# Patient Record
Sex: Female | Born: 1945 | Race: White | Hispanic: No | Marital: Married
Health system: Southern US, Community
[De-identification: ages and names within clinical notes are randomized; demographics above are authoritative.]

## PROBLEM LIST (undated history)

## (undated) DIAGNOSIS — F32A Depression, unspecified: Secondary | ICD-10-CM

## (undated) DIAGNOSIS — E079 Disorder of thyroid, unspecified: Secondary | ICD-10-CM

## (undated) DIAGNOSIS — E119 Type 2 diabetes mellitus without complications: Secondary | ICD-10-CM

---

## 2020-08-12 ENCOUNTER — Emergency Department (HOSPITAL_COMMUNITY)
Admission: EM | Admit: 2020-08-12 | Discharge: 2020-08-12 | Disposition: A | Discharge status: Home / Self Care | Payer: Medicare Other | Attending: Emergency Medicine | Admitting: Emergency Medicine

## 2020-08-12 ENCOUNTER — Emergency Department (HOSPITAL_COMMUNITY): Payer: Medicare Other

## 2020-08-12 ENCOUNTER — Encounter (HOSPITAL_COMMUNITY): Payer: Self-pay | Admitting: Emergency Medicine

## 2020-08-12 DIAGNOSIS — S4992XA Unspecified injury of left shoulder and upper arm, initial encounter: Secondary | ICD-10-CM | POA: Diagnosis present

## 2020-08-12 DIAGNOSIS — E119 Type 2 diabetes mellitus without complications: Secondary | ICD-10-CM | POA: Diagnosis not present

## 2020-08-12 DIAGNOSIS — W010XXA Fall on same level from slipping, tripping and stumbling without subsequent striking against object, initial encounter: Secondary | ICD-10-CM | POA: Insufficient documentation

## 2020-08-12 DIAGNOSIS — Y92009 Unspecified place in unspecified non-institutional (private) residence as the place of occurrence of the external cause: Secondary | ICD-10-CM | POA: Diagnosis not present

## 2020-08-12 DIAGNOSIS — S42202A Unspecified fracture of upper end of left humerus, initial encounter for closed fracture: Secondary | ICD-10-CM | POA: Insufficient documentation

## 2020-08-12 HISTORY — DX: Disorder of thyroid, unspecified: E07.9

## 2020-08-12 HISTORY — DX: Depression, unspecified: F32.A

## 2020-08-12 HISTORY — DX: Type 2 diabetes mellitus without complications: E11.9

## 2020-08-12 MED ORDER — HYDROCODONE-ACETAMINOPHEN 5-325 MG PO TABS
1.0000 | ORAL_TABLET | Freq: Once | ORAL | Status: AC
  Administered 2020-08-12: 1 via ORAL
  Filled 2020-08-12: qty 1

## 2020-08-12 NOTE — Progress Notes (Signed)
Orthopedic Tech Progress Note Patient Details:  Dominique Lynch [birth year] [id number]  Ortho Devices Ortho Device/Splint Location: applied arm sling to LUE Ortho Device/Splint Interventions: Ordered, Application   Post Interventions Patient Tolerated: Well Instructions Provided: Care of device   Jennye Moccasin 08/12/2020, 7:13 PM

## 2020-08-12 NOTE — ED Triage Notes (Signed)
Per EMS, patient from home, trip and fall landing on left shoulder. Pain to left shoulder and arm. Denies head injury, dizziness, and LOC.

## 2020-08-13 NOTE — ED Provider Notes (Signed)
[facility]-EMERGENCY DEPT Provider Note   CSN: [id number] Arrival date & time: 08/12/20  1400     History Chief Complaint  Patient presents with  . Fall    Dominique Lynch is a 74 y.o. female.  HPI      74yo female with history of DM, depression, thyroid disease presents with concern for fall and left shoulder pain.  She was helping her granddaughter move when she tripped over a box falling directly onto her left shoulder. No head trauma, no LOC, no neck pain or back pain. No numbness/weakness/n/v. No other areas of pain with exception of some elbow pain.  Has otherwise been in normal state of health.   Past Medical History:  Diagnosis Date  . Depression   . Diabetes mellitus without complication (HCC)   . Thyroid disease     There are no problems to display for this patient.     OB History   No obstetric history on file.     No family history on file.  Social History   Tobacco Use  . Smoking status: Not on file  Substance Use Topics  . Alcohol use: Not on file  . Drug use: Not on file    Home Medications Prior to Admission medications   Not on File    Allergies    Ace inhibitors  Review of Systems   Review of Systems  Constitutional: Negative for fever.  Eyes: Negative for visual disturbance.  Respiratory: Negative for cough and shortness of breath.   Cardiovascular: Negative for chest pain.  Gastrointestinal: Negative for abdominal pain, nausea and vomiting.  Genitourinary: Negative for difficulty urinating.  Musculoskeletal: Positive for arthralgias. Negative for back pain and neck pain.  Skin: Negative for rash.  Neurological: Negative for syncope, weakness, numbness and headaches.    Physical Exam Updated Vital Signs BP 129/73   Pulse 74   Temp 98.2 F (36.8 C)   Resp 16   Ht 5\' 1"  (1.549 m)   Wt 99.8 kg   SpO2 99%   BMI 41.57 kg/m   Physical Exam Vitals and nursing note reviewed.  Constitutional:       General: She is not in acute distress.    Appearance: She is well-developed. She is not diaphoretic.  HENT:     Head: Normocephalic and atraumatic.  Eyes:     Conjunctiva/sclera: Conjunctivae normal.  Cardiovascular:     Rate and Rhythm: Normal rate and regular rhythm.     Heart sounds: No gallop.   Pulmonary:     Effort: Pulmonary effort is normal. No respiratory distress.  Chest:     Chest wall: No tenderness.  Abdominal:     General: There is no distension.     Palpations: Abdomen is soft.     Tenderness: There is no abdominal tenderness. There is no guarding.  Musculoskeletal:        General: No tenderness.     Cervical back: Normal range of motion.     Comments: No tenderness of c/t/l spine (reports chronic l spine pain)   Normal strength/sensation distal UE/finger abduction/opponens/wrist abduction  Elbow diffuse tenderness, overlying abrasion  Tenderness and pain with minimal movement left upper arm  Skin:    General: Skin is warm and dry.     Findings: No erythema or rash.  Neurological:     Mental Status: She is alert and oriented to person, place, and time.     ED Results / Procedures / Treatments  Labs (all labs ordered are listed, but only abnormal results are displayed) Labs Reviewed - No data to display  EKG None  Radiology DG Shoulder Left  Result Date: 08/12/2020 CLINICAL DATA:  Fall with left shoulder pain EXAM: LEFT SHOULDER - 2+ VIEW COMPARISON:  None. FINDINGS: AC joint is intact. There is an acute mildly imp comminuted and impacted fracture involving the left humeral neck. Humeral head appears to project over the glenoid fossa. IMPRESSION: Acute mildly comminuted and impacted fracture involving the left humeral neck. Electronically Signed   By: Jasmine Pang M.D.   On: 08/12/2020 15:09   DG Humerus Left  Result Date: 08/12/2020 CLINICAL DATA:  Fall proximal humerus pain EXAM: LEFT HUMERUS - 2+ VIEW COMPARISON:  None. FINDINGS: Acute mildly  impacted fracture involving the left humeral neck. Mid to distal humerus is intact. IMPRESSION: Acute mildly impacted fracture involving the left humeral neck. Electronically Signed   By: Jasmine Pang M.D.   On: 08/12/2020 15:08    Procedures Procedures (including critical care time)  Medications Ordered in ED Medications  HYDROcodone-acetaminophen (NORCO/VICODIN) 5-325 MG per tablet 1 tablet (1 tablet Oral Given 08/12/20 1857)    ED Course  I have reviewed the triage vital signs and the nursing notes.  Pertinent labs & imaging results that were available during my care of the patient were reviewed by me and considered in my medical decision making (see chart for details).    MDM Rules/Calculators/A&P                          Very pleasant 74yo female with history of DM, depression, thyroid disease presents with concern for fall and left shoulder pain.  Mechanical fall with no head injury, headache, not on anticoagulation with exception of aspirin, low suspicion for ICH or spinal injury by history and exam.  Low suspicion for other acute medical concerns.  Has abrasion over left elbow. Discussed getting specific XR of left elbow given diffuse tenderness in this location however she prefers follow up with Orthopedics.  Humerus XR is limited for elbow evaluation but does not show any significant abnormalities and she declines furhter imaging at this time.  XR left shoulder and humerus show fracture left humeral neck fracture.  She is NV intact, closed fracture.  Placed in sling.  Has tramadol rx at home and declines other pain control. Sees [facility] for her back and would like to follow up with this group. Patient discharged in stable condition with understanding of reasons to return.    Final Clinical Impression(s) / ED Diagnoses Final diagnoses:  Closed fracture of proximal end of left humerus, unspecified fracture morphology, initial encounter    Rx / DC Orders ED Discharge  Orders    None       Alvira Monday, MD 08/13/20 1200

## 2021-04-08 IMAGING — CR DG HUMERUS 2V *L*
2 series · 2 of 2 positions shown · non-contrast
Comparison: None.

CLINICAL DATA: Fall proximal humerus pain

EXAM:
LEFT HUMERUS - 2+ VIEW

[w humerus ap left]
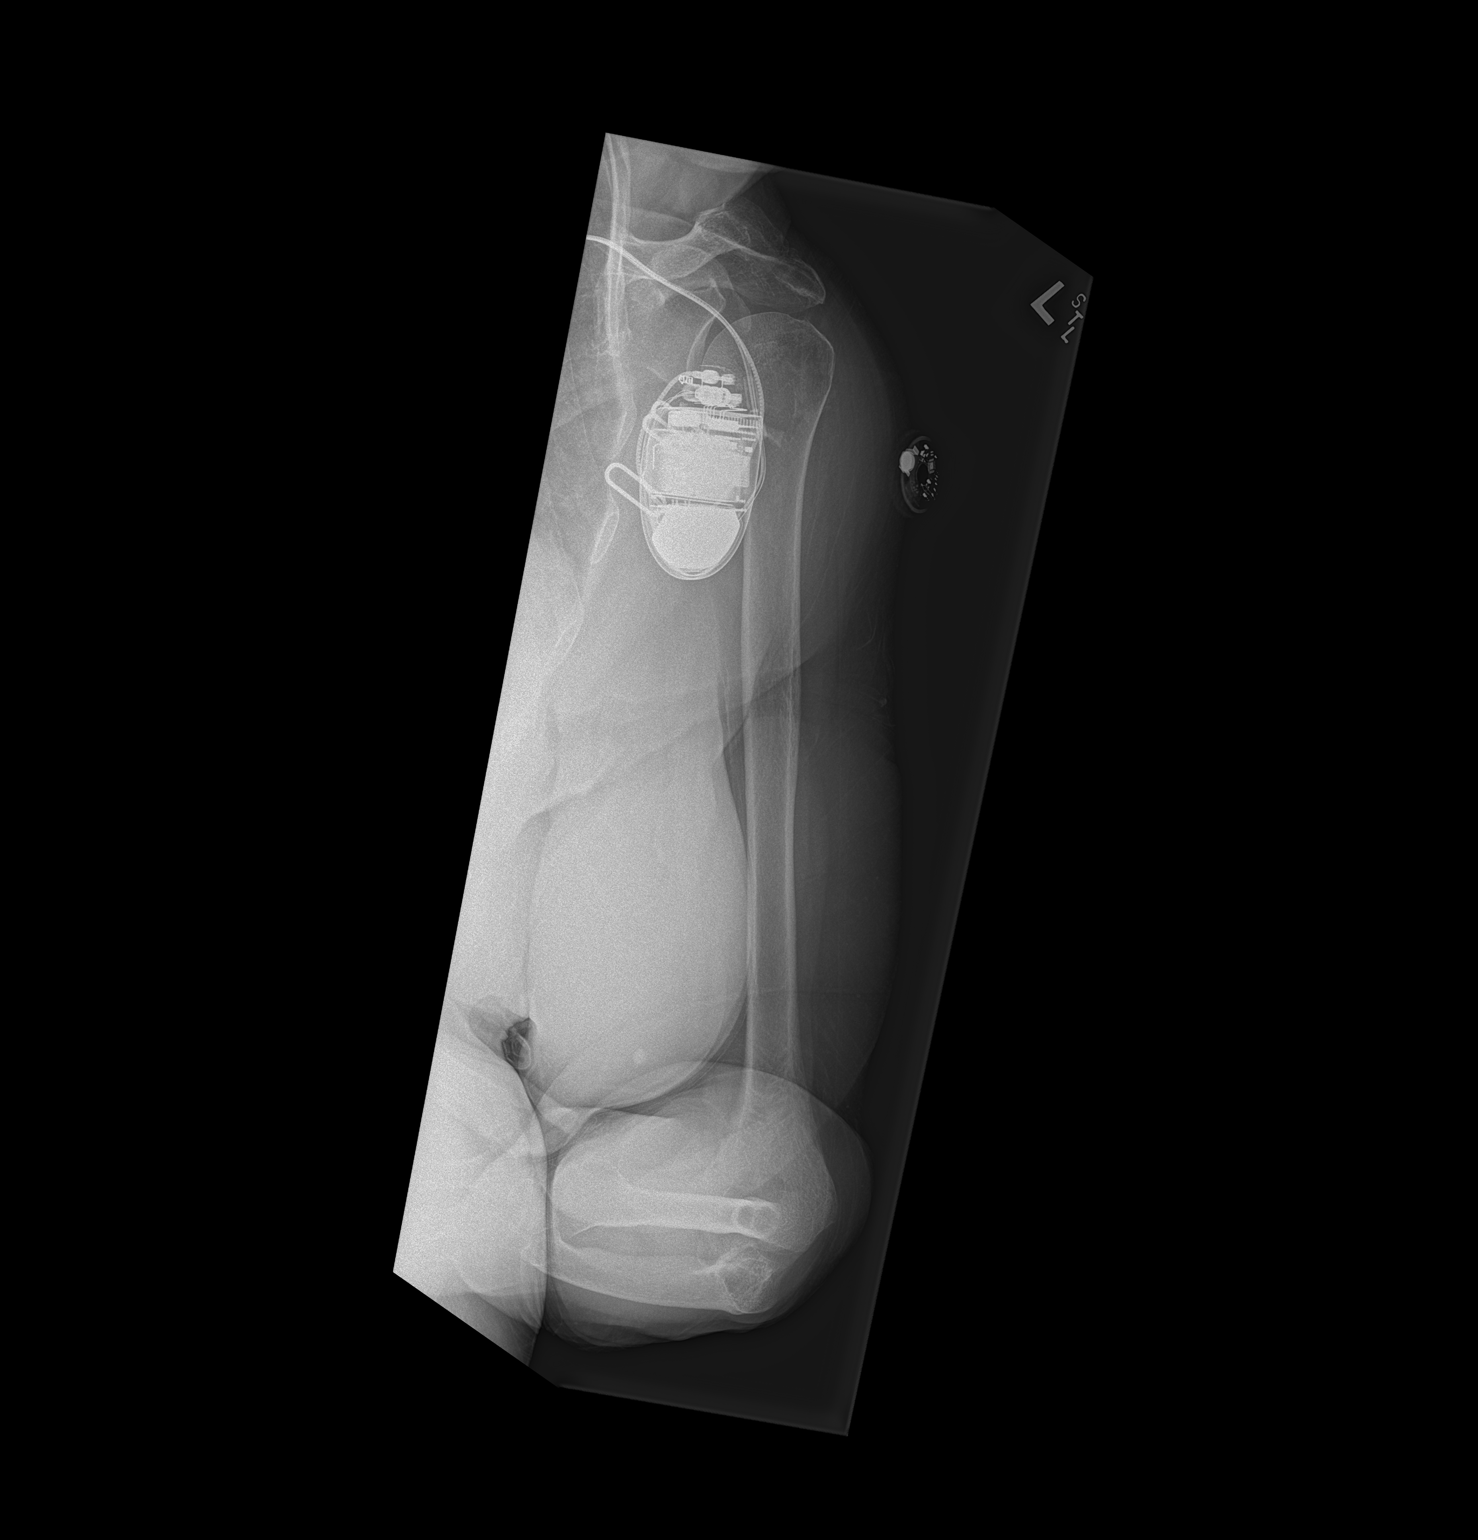

[w humerus lat left]
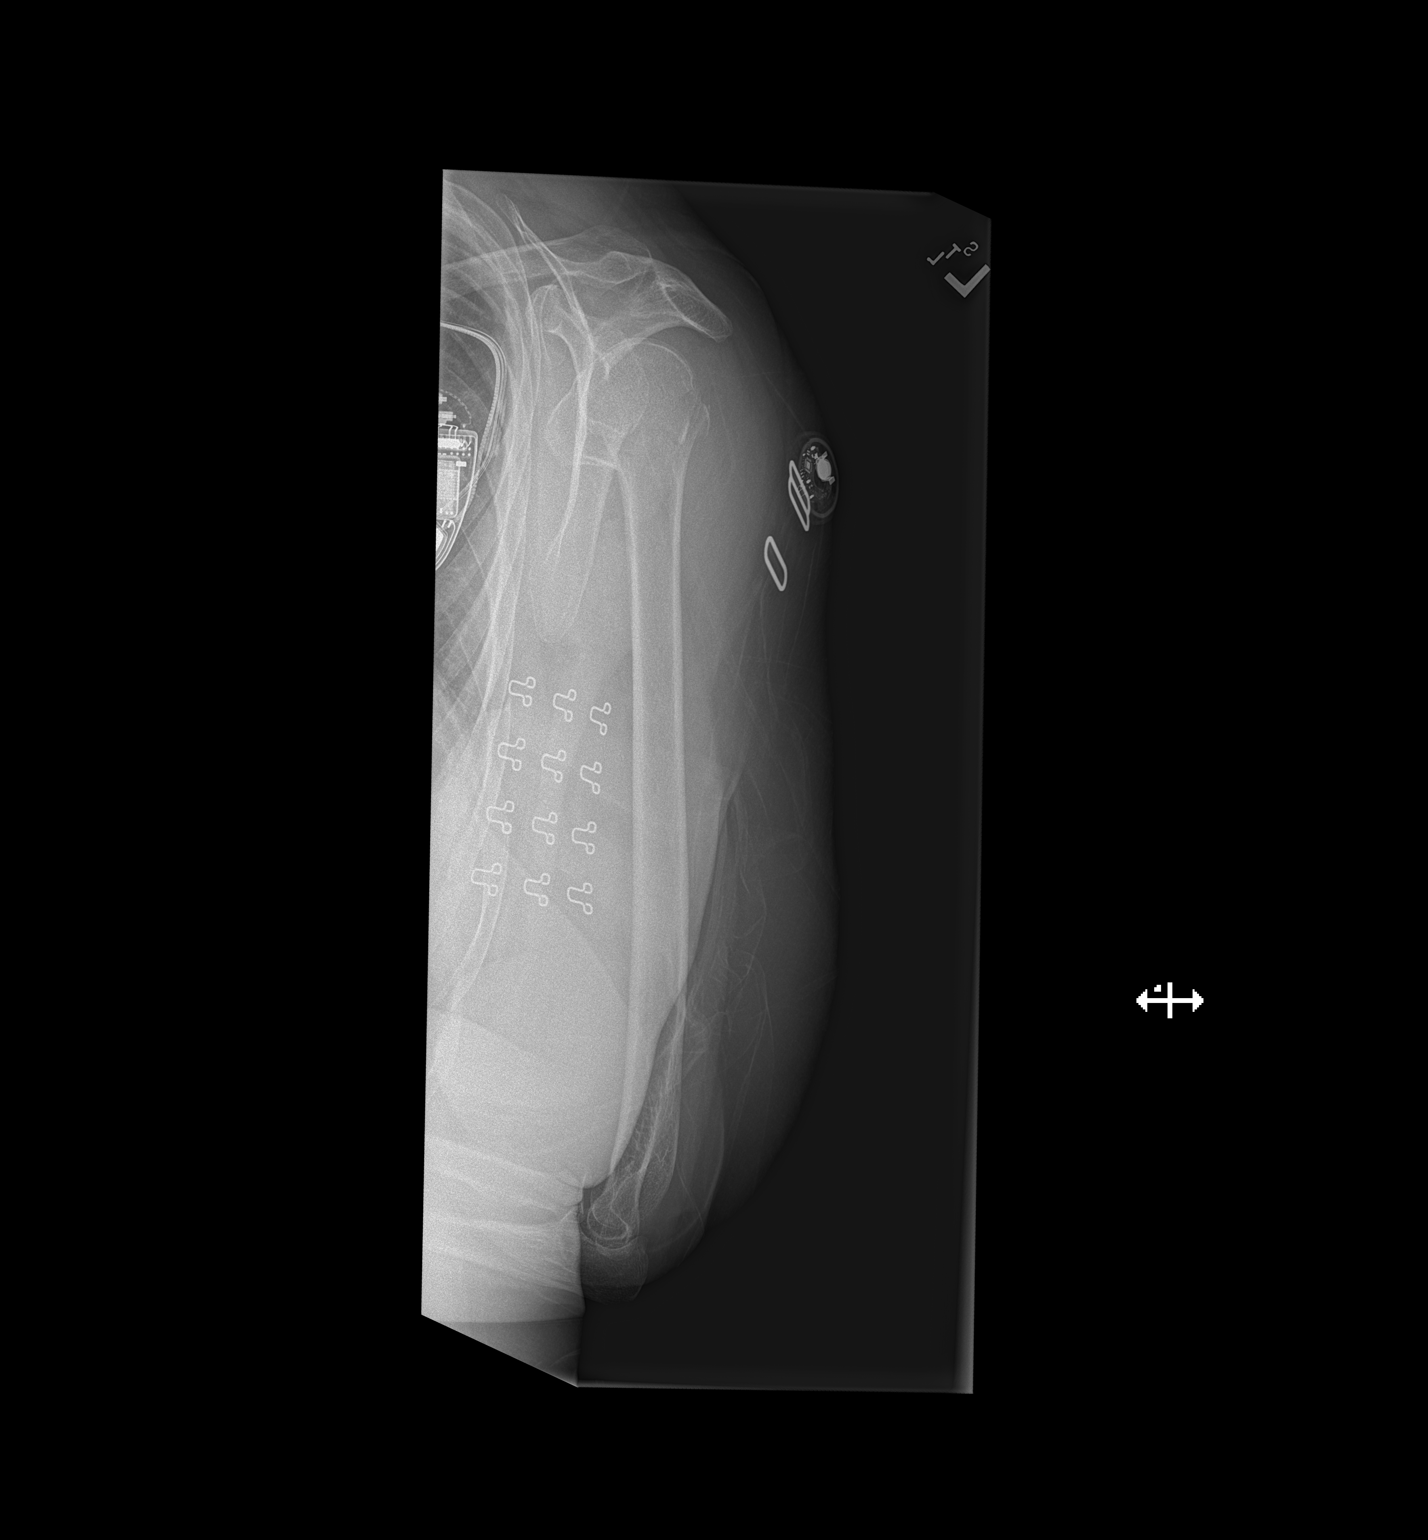

[2 of 2 positions shown; findings below may reference images not displayed]

FINDINGS: Acute mildly impacted fracture involving the left humeral neck. Mid
to distal humerus is intact.
IMPRESSION: Acute mildly impacted fracture involving the left humeral neck.
